# Patient Record
Sex: Male | Born: 1970 | Race: Black or African American | Hispanic: No | Marital: Single | Smoking: Never smoker
Health system: Southern US, Community
[De-identification: ages and names within clinical notes are randomized; demographics above are authoritative.]

## PROBLEM LIST (undated history)

## (undated) DIAGNOSIS — K859 Acute pancreatitis without necrosis or infection, unspecified: Secondary | ICD-10-CM

## (undated) DIAGNOSIS — J45909 Unspecified asthma, uncomplicated: Secondary | ICD-10-CM

---

## 2017-07-11 ENCOUNTER — Other Ambulatory Visit: Payer: Self-pay

## 2017-07-11 ENCOUNTER — Emergency Department (HOSPITAL_COMMUNITY): Payer: BLUE CROSS/BLUE SHIELD

## 2017-07-11 ENCOUNTER — Emergency Department (HOSPITAL_COMMUNITY)
Admission: EM | Admit: 2017-07-11 | Discharge: 2017-07-12 | Disposition: A | Discharge status: Home / Self Care | Payer: BLUE CROSS/BLUE SHIELD | Attending: Emergency Medicine | Admitting: Emergency Medicine

## 2017-07-11 ENCOUNTER — Encounter (HOSPITAL_COMMUNITY): Payer: Self-pay | Admitting: Emergency Medicine

## 2017-07-11 DIAGNOSIS — Z79899 Other long term (current) drug therapy: Secondary | ICD-10-CM | POA: Insufficient documentation

## 2017-07-11 DIAGNOSIS — R1013 Epigastric pain: Secondary | ICD-10-CM | POA: Diagnosis present

## 2017-07-11 DIAGNOSIS — K852 Alcohol induced acute pancreatitis without necrosis or infection: Secondary | ICD-10-CM | POA: Diagnosis not present

## 2017-07-11 DIAGNOSIS — J45909 Unspecified asthma, uncomplicated: Secondary | ICD-10-CM | POA: Diagnosis not present

## 2017-07-11 HISTORY — DX: Acute pancreatitis without necrosis or infection, unspecified: K85.90

## 2017-07-11 HISTORY — DX: Unspecified asthma, uncomplicated: J45.909

## 2017-07-11 LAB — CBC
HEMATOCRIT: 52.3 % — AB (ref 39.0–52.0)
Hemoglobin: 18.4 g/dL — ABNORMAL HIGH (ref 13.0–17.0)
MCH: 30.1 pg (ref 26.0–34.0)
MCHC: 35.2 g/dL (ref 30.0–36.0)
MCV: 85.5 fL (ref 78.0–100.0)
Platelets: 226 10*3/uL (ref 150–400)
RBC: 6.12 MIL/uL — ABNORMAL HIGH (ref 4.22–5.81)
RDW: 14.3 % (ref 11.5–15.5)
WBC: 10.7 10*3/uL — AB (ref 4.0–10.5)

## 2017-07-11 LAB — COMPREHENSIVE METABOLIC PANEL
ALBUMIN: 4.8 g/dL (ref 3.5–5.0)
ALT: 88 U/L — AB (ref 17–63)
AST: 92 U/L — AB (ref 15–41)
Alkaline Phosphatase: 75 U/L (ref 38–126)
Anion gap: 12 (ref 5–15)
BUN: 11 mg/dL (ref 6–20)
CHLORIDE: 100 mmol/L — AB (ref 101–111)
CO2: 23 mmol/L (ref 22–32)
CREATININE: 1.2 mg/dL (ref 0.61–1.24)
Calcium: 9.7 mg/dL (ref 8.9–10.3)
GFR calc Af Amer: >60 mL/min (ref >60)
GLUCOSE: 133 mg/dL — AB (ref 65–99)
POTASSIUM: 4.4 mmol/L (ref 3.5–5.1)
SODIUM: 135 mmol/L (ref 135–145)
Total Bilirubin: 1.3 mg/dL — ABNORMAL HIGH (ref 0.3–1.2)
Total Protein: 8 g/dL (ref 6.5–8.1)

## 2017-07-11 LAB — URINALYSIS, ROUTINE W REFLEX MICROSCOPIC
BACTERIA UA: NONE SEEN
BILIRUBIN URINE: NEGATIVE
Glucose, UA: NEGATIVE mg/dL
Hgb urine dipstick: NEGATIVE
KETONES UR: 5 mg/dL — AB
LEUKOCYTES UA: NEGATIVE
Nitrite: NEGATIVE
Protein, ur: 30 mg/dL — AB
SPECIFIC GRAVITY, URINE: 1.032 — AB (ref 1.005–1.030)
SQUAMOUS EPITHELIAL / LPF: NONE SEEN
pH: 6 (ref 5.0–8.0)

## 2017-07-11 LAB — LIPASE, BLOOD: LIPASE: 188 U/L — AB (ref 11–51)

## 2017-07-11 MED ORDER — HYDROMORPHONE HCL 1 MG/ML IJ SOLN
1.0000 mg | Freq: Once | INTRAMUSCULAR | Status: AC
  Administered 2017-07-11: 1 mg via INTRAVENOUS
  Filled 2017-07-11: qty 1

## 2017-07-11 MED ORDER — FENTANYL CITRATE (PF) 100 MCG/2ML IJ SOLN
50.0000 ug | INTRAMUSCULAR | Status: DC | PRN
  Administered 2017-07-11: 50 ug via NASAL
  Filled 2017-07-11: qty 2

## 2017-07-11 MED ORDER — ONDANSETRON HCL 4 MG/2ML IJ SOLN
4.0000 mg | Freq: Once | INTRAMUSCULAR | Status: AC
  Administered 2017-07-11: 4 mg via INTRAVENOUS
  Filled 2017-07-11: qty 2

## 2017-07-11 MED ORDER — SODIUM CHLORIDE 0.9 % IV BOLUS (SEPSIS)
1000.0000 mL | Freq: Once | INTRAVENOUS | Status: AC
  Administered 2017-07-11: 1000 mL via INTRAVENOUS

## 2017-07-11 NOTE — ED Provider Notes (Signed)
[facility] EMERGENCY DEPARTMENT Provider Note   CSN: [id number] Arrival date & time: 07/11/17  1922     History   Chief Complaint Chief Complaint  Patient presents with  . Abdominal Pain    HPI Christopher Ponce is a 46 y.o. male.  The history is provided by the patient and medical records.  Abdominal Pain   Associated symptoms include nausea and vomiting.     46 year old male with history of this, presenting to the ED with abdominal pain.  Reports he came home for Thanksgiving and indulged in some alcoholic beverages and lots of greasy food and thinks he flared up his pancreatitis again.  Reports epigastric, right and left upper abdominal pain.  Reports some nausea and vomiting.  No diarrhea.  No fever or chills.  Has no history of gallstones.  States usually he responds well to treatment in the ED.  No prior abdominal surgeries.  He did ttry taking pepcid and norco at home without relief.  Past Medical History:  Diagnosis Date  . Asthma   . Pancreatitis     There are no active problems to display for this patient.   History reviewed. No pertinent surgical history.     Home Medications    Prior to Admission medications   Medication Sig Start Date End Date Taking? Authorizing Provider  famotidine (PEPCID) 20 MG tablet Take 20 mg by mouth daily.   Yes [provider]  HYDROcodone-acetaminophen (NORCO/VICODIN) 5-325 MG tablet Take 1 tablet by mouth every 6 (six) hours as needed for moderate pain.   Yes [provider]  VENTOLIN HFA 108 (90 Base) MCG/ACT inhaler Inhale 1-2 puffs into the lungs every 4 (four) hours as needed for wheezing or shortness of breath. 06/12/17  Yes [provider]    Family History No family history on file.  Social History Social History   Tobacco Use  . Smoking status: Never Smoker  . Smokeless tobacco: Never Used  Substance Use Topics  . Alcohol use: Yes  . Drug use: No      Allergies   Patient has no known allergies.   Review of Systems Review of Systems  Gastrointestinal: Positive for abdominal pain, nausea and vomiting.  All other systems reviewed and are negative.    Physical Exam Updated Vital Signs BP (!) 185/119   Pulse 70   Temp 99.1 F (37.3 C) (Oral)   Resp 16   Ht 5\' 11"  (1.803 m)   Wt 88.5 kg (195 lb)   SpO2 99%   BMI 27.20 kg/m   Physical Exam  Constitutional: He is oriented to person, place, and time. He appears well-developed and well-nourished.  Appears uncomfortable  HENT:  Head: Normocephalic and atraumatic.  Mouth/Throat: Oropharynx is clear and moist.  Eyes: Conjunctivae and EOM are normal. Pupils are equal, round, and reactive to light.  Neck: Normal range of motion.  Cardiovascular: Normal rate, regular rhythm and normal heart sounds.  Pulmonary/Chest: Effort normal and breath sounds normal.  Abdominal: Soft. Bowel sounds are normal. There is tenderness in the right upper quadrant, epigastric area and left upper quadrant.  RUQ, epigastric, and LUQ tenderness; voluntary guarding, no peritoneal signs  Musculoskeletal: Normal range of motion.  Neurological: He is alert and oriented to person, place, and time.  Skin: Skin is warm and dry.  Psychiatric: He has a normal mood and affect.  Nursing note and vitals reviewed.    ED Treatments / Results  Labs (all labs ordered are  listed, but only abnormal results are displayed) Labs Reviewed  LIPASE, BLOOD - Abnormal; Notable for the following components:      Result Value   Lipase 188 (*)    All other components within normal limits  COMPREHENSIVE METABOLIC PANEL - Abnormal; Notable for the following components:   Chloride 100 (*)    Glucose, Bld 133 (*)    AST 92 (*)    ALT 88 (*)    Total Bilirubin 1.3 (*)    All other components within normal limits  CBC - Abnormal; Notable for the following components:   WBC 10.7 (*)    RBC 6.12 (*)    Hemoglobin 18.4  (*)    HCT 52.3 (*)    All other components within normal limits  URINALYSIS, ROUTINE W REFLEX MICROSCOPIC - Abnormal; Notable for the following components:   Specific Gravity, Urine 1.032 (*)    Ketones, ur 5 (*)    Protein, ur 30 (*)    All other components within normal limits    EKG  EKG Interpretation None       Radiology [place] Abdomen Complete  Result Date: 07/12/2017 CLINICAL DATA:  Epigastric pain. Recurrent pancreatitis with lipase 188. EXAM: ABDOMEN ULTRASOUND COMPLETE COMPARISON:  None. FINDINGS: Gallbladder: No gallstones or wall thickening visualized. No sonographic Murphy sign noted by sonographer. Common bile duct: Diameter: 3.8 mm, normal Liver: Liver parenchymal echotexture appears diffusely increased suggesting fatty infiltration. No focal lesions identified. Portal vein is patent on color Doppler imaging with normal direction of blood flow towards the liver. IVC: No abnormality visualized. Pancreas: Limited visualization of the pancreas due to bowel gas. The head of the pancreas appears to be somewhat prominent. No focal lesions are definitively identified. No visible peripancreatic fluid collections or pancreatic ductal dilatation. Spleen: Multiple calcified granulomas are demonstrated throughout the spleen. No splenic enlargement. Right Kidney: Length: 11.8 cm. Echogenicity within normal limits. No mass or hydronephrosis visualized. Left Kidney: Length: 12 cm. Echogenicity within normal limits. No mass or hydronephrosis visualized. Abdominal aorta: No aneurysm visualized. Other findings: None. IMPRESSION: 1. Probable diffuse fatty infiltration of the liver. 2. Limited visualization of the pancreas due to bowel gas but head of pancreas appears to be somewhat prominent. No definitive inflammatory changes on limited visualization. 3. Calcified granulomas in the spleen. Electronically Signed   By: Burman NievesWilliam  Stevens M.D.   On: 07/12/2017 00:32    Procedures Procedures (including  critical care time)  Medications Ordered in ED Medications  fentaNYL (SUBLIMAZE) injection 50 mcg (50 mcg Nasal Given 07/11/17 2000)  HYDROmorphone (DILAUDID) injection 1 mg (1 mg Intravenous Given 07/11/17 2329)  ondansetron (ZOFRAN) injection 4 mg (4 mg Intravenous Given 07/11/17 2329)  sodium chloride 0.9 % bolus 1,000 mL (0 mLs Intravenous Stopped 07/12/17 0145)  sodium chloride 0.9 % bolus 1,000 mL (0 mLs Intravenous Stopped 07/12/17 0232)  HYDROmorphone (DILAUDID) injection 1 mg (1 mg Intravenous Given 07/12/17 0143)  HYDROmorphone (DILAUDID) injection 1 mg (1 mg Intravenous Given 07/12/17 0232)     Initial Impression / Assessment and Plan / ED Course  I have reviewed the triage vital signs and the nursing notes.  Pertinent labs & imaging results that were available during my care of the patient were reviewed by me and considered in my medical decision making (see chart for details).  46 y.o. M here with epigastric pain.  Has hx of pancreatitis with similar symptoms.  Does report EtOH during Thanksgiving holiday.  Suspect this may be culprit.  Abdomen  soft, tenderness in the epigastrium.  No peritoneal signs.  Screening lab work obtained from triage is consistent with pancreatitis.  Will aim for symptomatic control.  Given IVF, pain and nausea meds.  US to be obtained as patient unsure of any hx of gallstones, etc.  1:18 AM After initial medications, patient reports he is feeling better, still feels a little dehydrated and is having some intermittent waves of pain.  Will give additional fluids and re-dose medications.  US without acute findings to suggest gallstone pancreatitis.  Suspect EtOH related.    Patient has had 3 doses of medication.  Reports he is feeling better.  He wants to try to go home, I feel this is reasonable.  Will prescribe pain and nausea medications for symptomatic control.  Recommended clear liquid diet for now, progress back to normal as tolerated.  He  understands that if symptoms are not well controlled, he is not tolerating his oral medications, or if symptoms are worsening he can return here for further treatment.  Otherwise he will follow-up with his primary care doctor once he returns home.  Patient discharged home in stable condition.  Final Clinical Impressions(s) / ED Diagnoses   Final diagnoses:  Alcohol-induced acute pancreatitis, unspecified complication status    ED Discharge Orders        Ordered    oxyCODONE-acetaminophen (PERCOCET) 5-325 MG tablet  Every 4 hours PRN     07/12/17 0300    ondansetron (ZOFRAN ODT) 4 MG disintegrating tablet  Every 8 hours PRN     07/12/17 0300       Garlon HatchetSanders, Terisa Belardo M, PA-C 07/12/17 0309    Geoffery Lyonselo, Douglas, MD 07/12/17 [phone]

## 2017-07-11 NOTE — ED Triage Notes (Signed)
Pt presents with epigastric pain radiating to L UQ. Pt states this feels similar to pain associated with pancreatitis in the past. Pt states he has been indulging in greasy food and alcohol in last few days. Pt has been taking famotidine and Norco without relief.

## 2017-07-12 MED ORDER — SODIUM CHLORIDE 0.9 % IV BOLUS (SEPSIS)
1000.0000 mL | Freq: Once | INTRAVENOUS | Status: AC
  Administered 2017-07-12: 1000 mL via INTRAVENOUS

## 2017-07-12 MED ORDER — HYDROMORPHONE HCL 1 MG/ML IJ SOLN
1.0000 mg | Freq: Once | INTRAMUSCULAR | Status: AC
  Administered 2017-07-12: 1 mg via INTRAVENOUS
  Filled 2017-07-12: qty 1

## 2017-07-12 MED ORDER — ONDANSETRON 4 MG PO TBDP: 4.0000 mg | ORAL_TABLET | Freq: Three times a day (TID) | ORAL | 0 refills | Status: AC | PRN

## 2017-07-12 MED ORDER — OXYCODONE-ACETAMINOPHEN 5-325 MG PO TABS: 1.0000 | ORAL_TABLET | ORAL | 0 refills | Status: AC | PRN

## 2017-07-12 NOTE — Discharge Instructions (Signed)
Take the prescribed medication as directed.  Try to stick with liquid diet for now, progress back to normal as tolerated. Follow-up with your primary care doctor. Return to the ED for new or worsening symptoms.

## 2017-07-13 ENCOUNTER — Encounter (HOSPITAL_COMMUNITY): Payer: Self-pay | Admitting: *Deleted

## 2017-07-13 ENCOUNTER — Emergency Department (HOSPITAL_COMMUNITY)
Admission: EM | Admit: 2017-07-13 | Discharge: 2017-07-13 | Disposition: A | Discharge status: Home / Self Care | Payer: BLUE CROSS/BLUE SHIELD | Attending: Emergency Medicine | Admitting: Emergency Medicine

## 2017-07-13 ENCOUNTER — Other Ambulatory Visit: Payer: Self-pay

## 2017-07-13 DIAGNOSIS — J45909 Unspecified asthma, uncomplicated: Secondary | ICD-10-CM | POA: Insufficient documentation

## 2017-07-13 DIAGNOSIS — Z79899 Other long term (current) drug therapy: Secondary | ICD-10-CM | POA: Diagnosis not present

## 2017-07-13 DIAGNOSIS — K852 Alcohol induced acute pancreatitis without necrosis or infection: Secondary | ICD-10-CM | POA: Insufficient documentation

## 2017-07-13 DIAGNOSIS — R1013 Epigastric pain: Secondary | ICD-10-CM | POA: Diagnosis present

## 2017-07-13 LAB — COMPREHENSIVE METABOLIC PANEL
ALBUMIN: 4.1 g/dL (ref 3.5–5.0)
ALK PHOS: 59 U/L (ref 38–126)
ALT: 51 U/L (ref 17–63)
ANION GAP: 8 (ref 5–15)
AST: 32 U/L (ref 15–41)
BILIRUBIN TOTAL: 1.1 mg/dL (ref 0.3–1.2)
BUN: 10 mg/dL (ref 6–20)
CO2: 25 mmol/L (ref 22–32)
Calcium: 9 mg/dL (ref 8.9–10.3)
Chloride: 101 mmol/L (ref 101–111)
Creatinine, Ser: 1.18 mg/dL (ref 0.61–1.24)
GFR calc Af Amer: >60 mL/min (ref >60)
GLUCOSE: 117 mg/dL — AB (ref 65–99)
Potassium: 4 mmol/L (ref 3.5–5.1)
Sodium: 134 mmol/L — ABNORMAL LOW (ref 135–145)
TOTAL PROTEIN: 7.2 g/dL (ref 6.5–8.1)

## 2017-07-13 LAB — CBC
HCT: 48.1 % (ref 39.0–52.0)
HEMOGLOBIN: 16.7 g/dL (ref 13.0–17.0)
MCH: 29.4 pg (ref 26.0–34.0)
MCHC: 34.7 g/dL (ref 30.0–36.0)
MCV: 84.7 fL (ref 78.0–100.0)
Platelets: 209 10*3/uL (ref 150–400)
RBC: 5.68 MIL/uL (ref 4.22–5.81)
RDW: 14 % (ref 11.5–15.5)
WBC: 10.4 10*3/uL (ref 4.0–10.5)

## 2017-07-13 LAB — LIPASE, BLOOD: Lipase: 126 U/L — ABNORMAL HIGH (ref 11–51)

## 2017-07-13 MED ORDER — MORPHINE SULFATE (PF) 4 MG/ML IV SOLN
4.0000 mg | Freq: Once | INTRAVENOUS | Status: AC
  Administered 2017-07-13: 4 mg via INTRAVENOUS
  Filled 2017-07-13: qty 1

## 2017-07-13 MED ORDER — ONDANSETRON HCL 4 MG/2ML IJ SOLN
4.0000 mg | Freq: Once | INTRAMUSCULAR | Status: AC
  Administered 2017-07-13: 4 mg via INTRAVENOUS
  Filled 2017-07-13: qty 2

## 2017-07-13 MED ORDER — SODIUM CHLORIDE 0.9 % IV BOLUS (SEPSIS)
1000.0000 mL | Freq: Once | INTRAVENOUS | Status: AC
  Administered 2017-07-13: 1000 mL via INTRAVENOUS

## 2017-07-13 NOTE — ED Provider Notes (Signed)
[facility] Provider Note   CSN: [id number] Arrival date & time: 07/13/17  0305     History   Chief Complaint Chief Complaint  Patient presents with  . Abdominal Pain    HPI Christopher Ponce is a 46 y.o. male.  Patient is a 46 year old male with past medical history of chronic pancreatitis presenting with complaints of epigastric pain.  [place] from [place] the [place] and states that he has been drinking alcohol and eating spicy foods.  He was here yesterday with similar complaints and given IV fluids and pain medicine.  He returns today stating that he is no better and that he feels "dehydrated".  He denies any fevers or chills.  He denies any bloody vomit or stool.   The history is provided by the patient.  Abdominal Pain   This is a recurrent problem.    Past Medical History:  Diagnosis Date  . Asthma   . Pancreatitis     There are no active problems to display for this patient.   History reviewed. No pertinent surgical history.     Home Medications    Prior to Admission medications   Medication Sig Start Date End Date Taking? Authorizing Provider  famotidine (PEPCID) 20 MG tablet Take 20 mg by mouth daily.    [provider]  HYDROcodone-acetaminophen (NORCO/VICODIN) 5-325 MG tablet Take 1 tablet by mouth every 6 (six) hours as needed for moderate pain.    [provider]  ondansetron (ZOFRAN ODT) 4 MG disintegrating tablet Take 1 tablet (4 mg total) by mouth every 8 (eight) hours as needed for nausea. 07/12/17   Garlon HatchetSanders, Lisa M, PA-C  oxyCODONE-acetaminophen (PERCOCET) 5-325 MG tablet Take 1 tablet by mouth every 4 (four) hours as needed. 07/12/17   Garlon HatchetSanders, Lisa M, PA-C  VENTOLIN HFA 108 (90 Base) MCG/ACT inhaler Inhale 1-2 puffs into the lungs every 4 (four) hours as needed for wheezing or shortness of breath. 06/12/17   [provider]    Family History No family history  on file.  Social History Social History   Tobacco Use  . Smoking status: Never Smoker  . Smokeless tobacco: Never Used  Substance Use Topics  . Alcohol use: Yes  . Drug use: No     Allergies   Patient has no known allergies.   Review of Systems Review of Systems  Gastrointestinal: Positive for abdominal pain.     Physical Exam Updated Vital Signs BP (!) 160/119   Pulse 75   Temp 98.4 F (36.9 C)   Resp 16   Ht 5\' 11"  (1.803 m)   Wt 88.5 kg (195 lb)   SpO2 95%   BMI 27.20 kg/m   Physical Exam   ED Treatments / Results  Labs (all labs ordered are listed, but only abnormal results are displayed) Labs Reviewed  LIPASE, BLOOD - Abnormal; Notable for the following components:      Result Value   Lipase 126 (*)    All other components within normal limits  COMPREHENSIVE METABOLIC PANEL - Abnormal; Notable for the following components:   Sodium 134 (*)    Glucose, Bld 117 (*)    All other components within normal limits  CBC    EKG  EKG Interpretation None       Radiology [place] Abdomen Complete  Result Date: 07/12/2017 CLINICAL DATA:  Epigastric pain. Recurrent pancreatitis with lipase 188. EXAM: ABDOMEN ULTRASOUND COMPLETE COMPARISON:  None. FINDINGS: Gallbladder:  No gallstones or wall thickening visualized. No sonographic Murphy sign noted by sonographer. Common bile duct: Diameter: 3.8 mm, normal Liver: Liver parenchymal echotexture appears diffusely increased suggesting fatty infiltration. No focal lesions identified. Portal vein is patent on color Doppler imaging with normal direction of blood flow towards the liver. IVC: No abnormality visualized. Pancreas: Limited visualization of the pancreas due to bowel gas. The head of the pancreas appears to be somewhat prominent. No focal lesions are definitively identified. No visible peripancreatic fluid collections or pancreatic ductal dilatation. Spleen: Multiple calcified granulomas are demonstrated throughout  the spleen. No splenic enlargement. Right Kidney: Length: 11.8 cm. Echogenicity within normal limits. No mass or hydronephrosis visualized. Left Kidney: Length: 12 cm. Echogenicity within normal limits. No mass or hydronephrosis visualized. Abdominal aorta: No aneurysm visualized. Other findings: None. IMPRESSION: 1. Probable diffuse fatty infiltration of the liver. 2. Limited visualization of the pancreas due to bowel gas but head of pancreas appears to be somewhat prominent. No definitive inflammatory changes on limited visualization. 3. Calcified granulomas in the spleen. Electronically Signed   By: Burman NievesWilliam  Stevens M.D.   On: 07/12/2017 00:32    Procedures Procedures (including critical care time)  Medications Ordered in ED Medications  sodium chloride 0.9 % bolus 1,000 mL (not administered)  ondansetron (ZOFRAN) injection 4 mg (not administered)  morphine 4 MG/ML injection 4 mg (not administered)     Initial Impression / Assessment and Plan / ED Course  I have reviewed the triage vital signs and the nursing notes.  Pertinent labs & imaging results that were available during my care of the patient were reviewed by me and considered in my medical decision making (see chart for details).  Patient presenting with a flareup of chronic pancreatitis related to alcohol and fatty food intake over the holidays.  He is feeling better after fluids and medications in the ER.  His laboratory studies and physical examination are reassuring.  He is nontoxic-appearing and I do not feel will require admission.  He is to follow-up with his gastroenterologist when he returns home in [place].  Final Clinical Impressions(s) / ED Diagnoses   Final diagnoses:  None    ED Discharge Orders    None       Geoffery Lyonselo, Alok Minshall, MD 07/13/17 [phone]

## 2017-07-13 NOTE — ED Triage Notes (Signed)
The [place] is visiting here from [place] and he has been driinking alcohol and spicy foods  He was seen here yesterday for the same  He feels dry and he has a little pain

## 2017-07-13 NOTE — Discharge Instructions (Signed)
Clear liquid diet for the next 24 hours, then slowly advance to normal.  Return to the ER if symptoms significantly worsen or change.  Be sure to follow-up with your gastroenterologist once you return home.

## 2017-12-01 IMAGING — US US ABDOMEN COMPLETE
1 series · 13 of 25 positions shown · non-contrast
Comparison: None.

CLINICAL DATA: Epigastric pain. Recurrent pancreatitis with lipase
188.

EXAM:
ABDOMEN ULTRASOUND COMPLETE

[Series 1: us abdomen complete · 0.25mm/px · 13 of 92 slices shown]
[im 1/92]
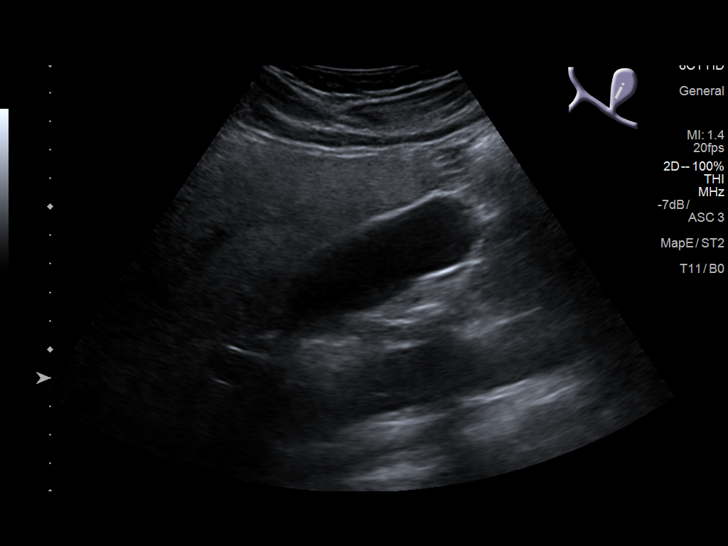
[im 8/92]
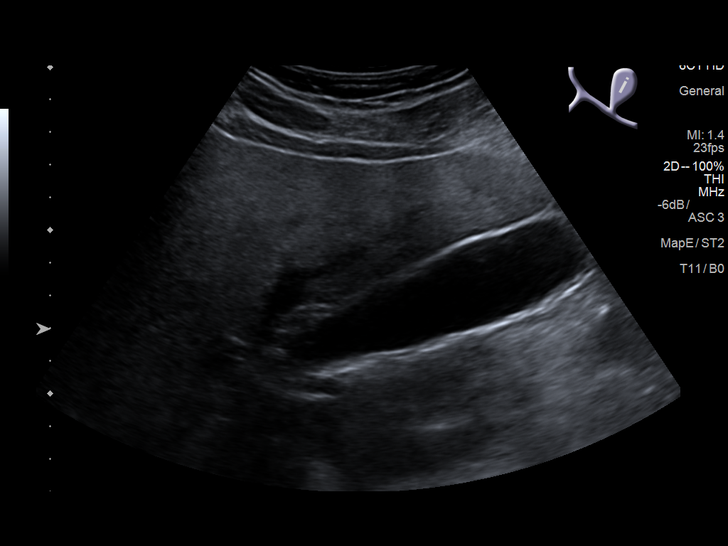
[im 16/92]
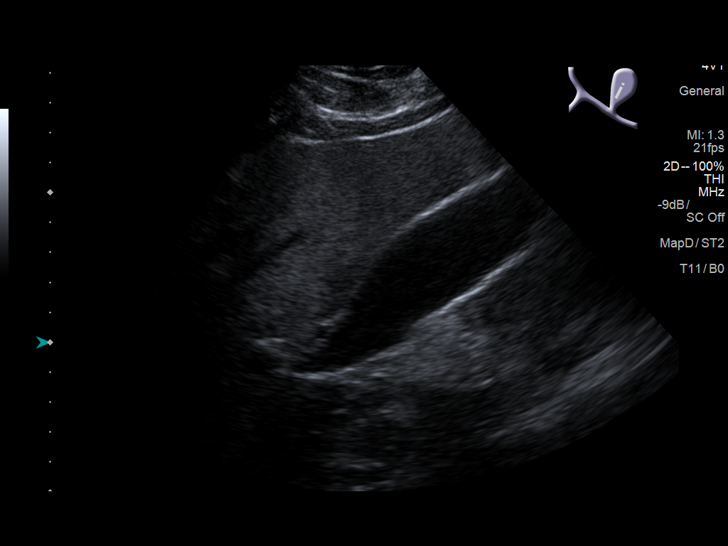
[im 23/92]
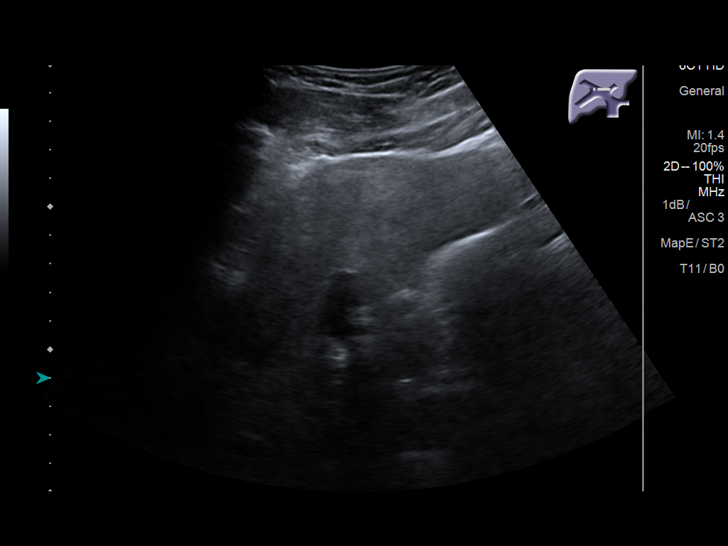
[im 31/92]
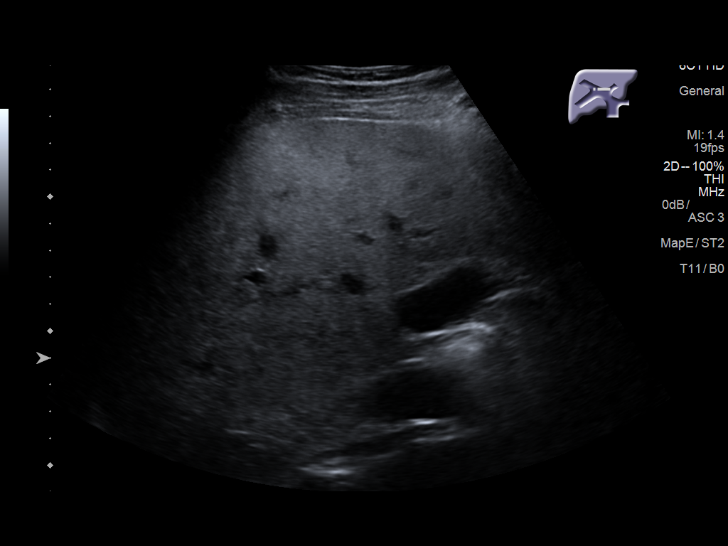
[im 38/92]
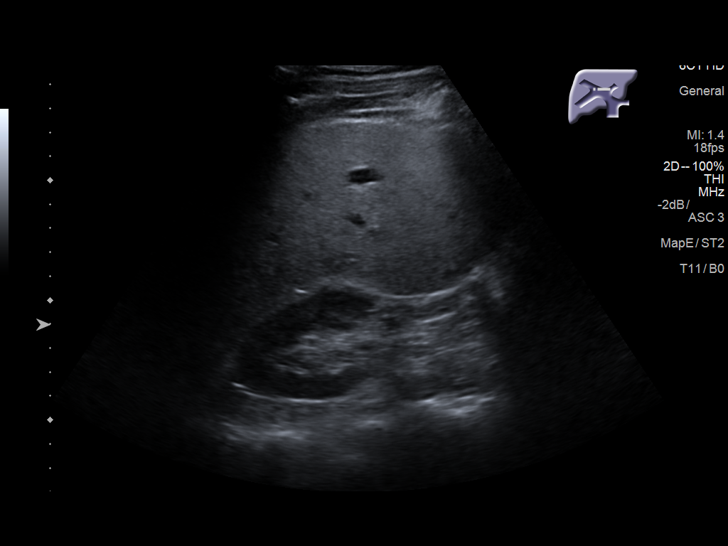
[im 46/92]
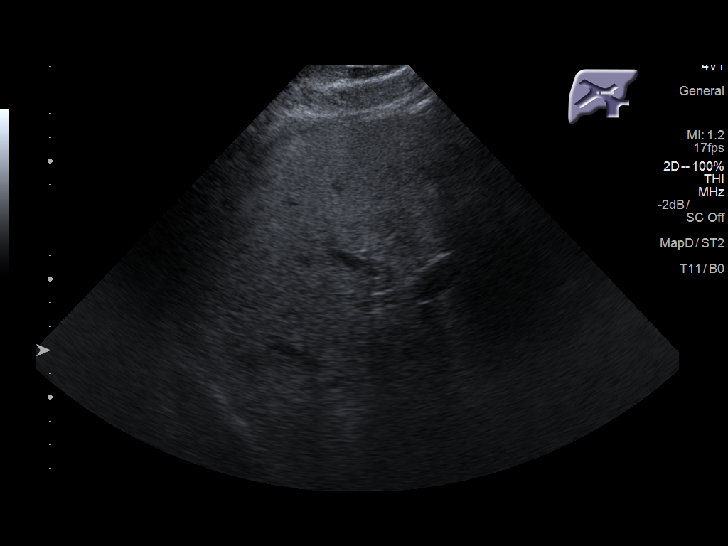
[im 54/92]
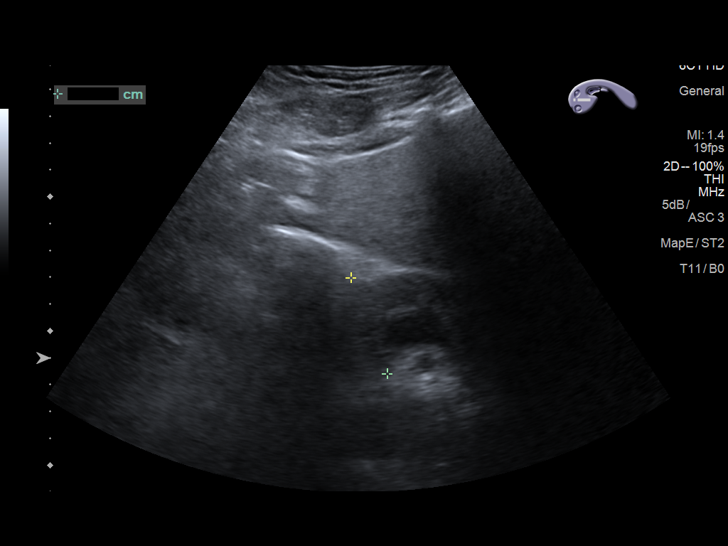
[im 61/92]
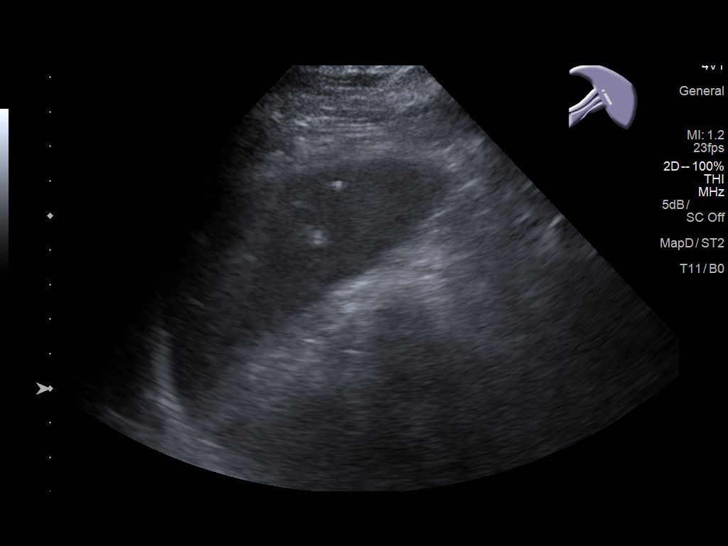
[im 69/92]
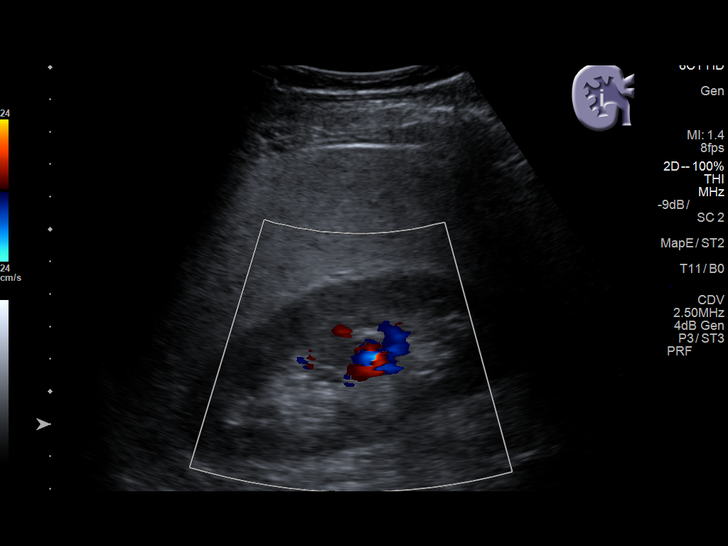
[im 76/92]
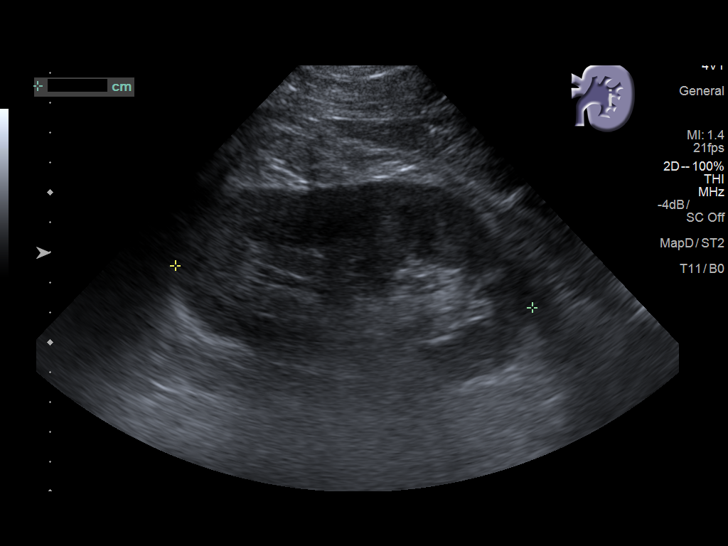
[im 84/92]
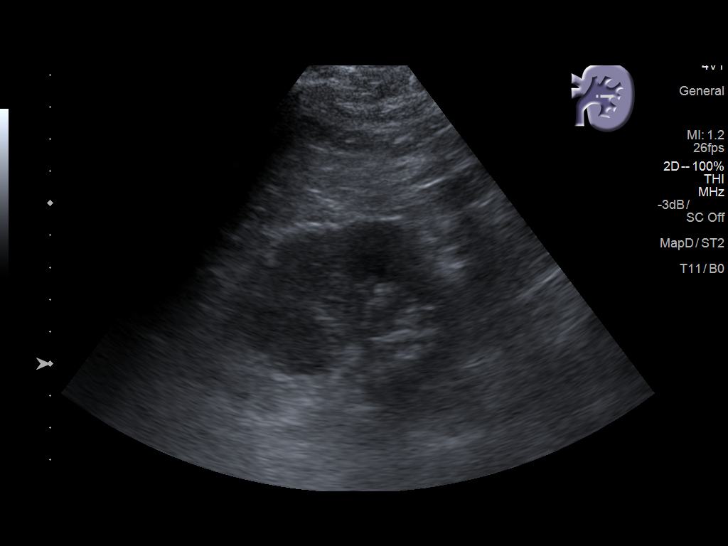
[im 92/92]
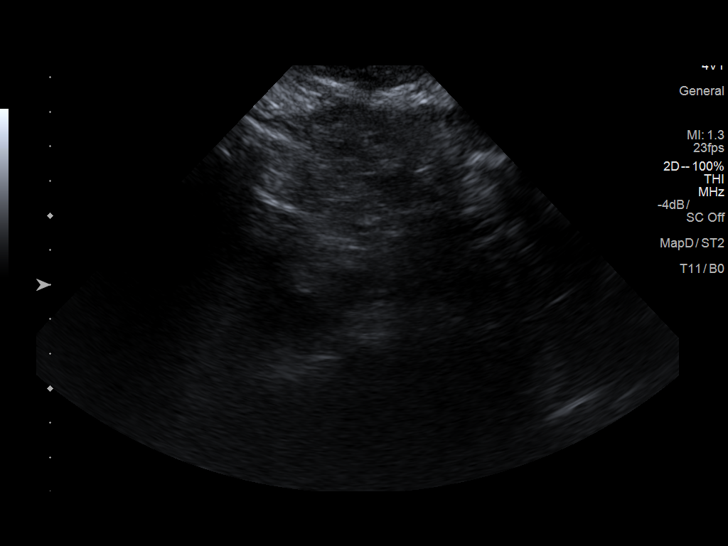

[13 of 25 positions shown; findings below may reference images not displayed]

FINDINGS: Gallbladder: No gallstones or wall thickening visualized. No
sonographic Murphy sign noted by sonographer.

Common bile duct: Diameter: 3.8 mm, normal

Liver: Liver parenchymal echotexture appears diffusely increased
suggesting fatty infiltration. No focal lesions identified. Portal
vein is patent on color Doppler imaging with normal direction of
blood flow towards the liver.

IVC: No abnormality visualized.

Pancreas: Limited visualization of the pancreas due to bowel gas.
The head of the pancreas appears to be somewhat prominent. No focal
lesions are definitively identified. No visible peripancreatic fluid
collections or pancreatic ductal dilatation.

Spleen: Multiple calcified granulomas are demonstrated throughout
the spleen. No splenic enlargement.

Right Kidney: Length: 11.8 cm. Echogenicity within normal limits. No
mass or hydronephrosis visualized.

Left Kidney: Length: 12 cm. Echogenicity within normal limits. No
mass or hydronephrosis visualized.

Abdominal aorta: No aneurysm visualized.

Other findings: None.
IMPRESSION: 1. Probable diffuse fatty infiltration of the liver.
2. Limited visualization of the pancreas due to bowel gas but head
of pancreas appears to be somewhat prominent. No definitive
inflammatory changes on limited visualization.
3. Calcified granulomas in the spleen.
